# Patient Record
Sex: Male | Born: 1940 | Race: White | Hispanic: No | Marital: Married | Smoking: Current every day smoker
Health system: Southern US, Community
[De-identification: ages and names within clinical notes are randomized; demographics above are authoritative.]

## PROBLEM LIST (undated history)

## (undated) DIAGNOSIS — J449 Chronic obstructive pulmonary disease, unspecified: Secondary | ICD-10-CM

## (undated) DIAGNOSIS — I10 Essential (primary) hypertension: Secondary | ICD-10-CM

## (undated) HISTORY — PX: BACK SURGERY: SHX140

## (undated) HISTORY — PX: OTHER SURGICAL HISTORY: SHX169

---

## 2016-02-06 ENCOUNTER — Inpatient Hospital Stay (HOSPITAL_COMMUNITY)
Admission: EM | Admit: 2016-02-06 | Discharge: 2016-02-11 | DRG: 516 | Disposition: A | Discharge status: Home Health | Payer: Medicare PPO | Attending: General Surgery | Admitting: General Surgery

## 2016-02-06 ENCOUNTER — Emergency Department (HOSPITAL_COMMUNITY): Payer: Medicare PPO

## 2016-02-06 ENCOUNTER — Encounter (HOSPITAL_COMMUNITY): Payer: Self-pay

## 2016-02-06 DIAGNOSIS — S32461A Displaced associated transverse-posterior fracture of right acetabulum, initial encounter for closed fracture: Secondary | ICD-10-CM | POA: Diagnosis present

## 2016-02-06 DIAGNOSIS — R339 Retention of urine, unspecified: Secondary | ICD-10-CM | POA: Diagnosis not present

## 2016-02-06 DIAGNOSIS — Z79899 Other long term (current) drug therapy: Secondary | ICD-10-CM | POA: Diagnosis not present

## 2016-02-06 DIAGNOSIS — R319 Hematuria, unspecified: Secondary | ICD-10-CM | POA: Diagnosis present

## 2016-02-06 DIAGNOSIS — D62 Acute posthemorrhagic anemia: Secondary | ICD-10-CM | POA: Diagnosis not present

## 2016-02-06 DIAGNOSIS — K432 Incisional hernia without obstruction or gangrene: Secondary | ICD-10-CM | POA: Diagnosis not present

## 2016-02-06 DIAGNOSIS — N179 Acute kidney failure, unspecified: Secondary | ICD-10-CM | POA: Diagnosis present

## 2016-02-06 DIAGNOSIS — S5002XA Contusion of left elbow, initial encounter: Secondary | ICD-10-CM | POA: Diagnosis present

## 2016-02-06 DIAGNOSIS — I10 Essential (primary) hypertension: Secondary | ICD-10-CM | POA: Diagnosis not present

## 2016-02-06 DIAGNOSIS — J449 Chronic obstructive pulmonary disease, unspecified: Secondary | ICD-10-CM | POA: Diagnosis present

## 2016-02-06 DIAGNOSIS — Z419 Encounter for procedure for purposes other than remedying health state, unspecified: Secondary | ICD-10-CM

## 2016-02-06 DIAGNOSIS — R911 Solitary pulmonary nodule: Secondary | ICD-10-CM | POA: Diagnosis not present

## 2016-02-06 DIAGNOSIS — D696 Thrombocytopenia, unspecified: Secondary | ICD-10-CM | POA: Diagnosis present

## 2016-02-06 DIAGNOSIS — N401 Enlarged prostate with lower urinary tract symptoms: Secondary | ICD-10-CM | POA: Diagnosis not present

## 2016-02-06 DIAGNOSIS — F17210 Nicotine dependence, cigarettes, uncomplicated: Secondary | ICD-10-CM | POA: Diagnosis not present

## 2016-02-06 DIAGNOSIS — M79602 Pain in left arm: Secondary | ICD-10-CM | POA: Diagnosis present

## 2016-02-06 DIAGNOSIS — S32401A Unspecified fracture of right acetabulum, initial encounter for closed fracture: Secondary | ICD-10-CM | POA: Diagnosis present

## 2016-02-06 DIAGNOSIS — T07XXXA Unspecified multiple injuries, initial encounter: Secondary | ICD-10-CM

## 2016-02-06 DIAGNOSIS — N400 Enlarged prostate without lower urinary tract symptoms: Secondary | ICD-10-CM | POA: Diagnosis present

## 2016-02-06 HISTORY — DX: Chronic obstructive pulmonary disease, unspecified: J44.9

## 2016-02-06 HISTORY — DX: Essential (primary) hypertension: I10

## 2016-02-06 LAB — PROTIME-INR
INR: 1.08 (ref 0.00–1.49)
Prothrombin Time: 14.2 seconds (ref 11.6–15.2)

## 2016-02-06 LAB — I-STAT CHEM 8, ED
BUN: 32 mg/dL — ABNORMAL HIGH (ref 6–20)
CALCIUM ION: 1.22 mmol/L (ref 1.13–1.30)
CREATININE: 1.4 mg/dL — AB (ref 0.61–1.24)
Chloride: 109 mmol/L (ref 101–111)
Glucose, Bld: 118 mg/dL — ABNORMAL HIGH (ref 65–99)
HEMATOCRIT: 40 % (ref 39.0–52.0)
HEMOGLOBIN: 13.6 g/dL (ref 13.0–17.0)
Potassium: 4.8 mmol/L (ref 3.5–5.1)
Sodium: 140 mmol/L (ref 135–145)
TCO2: 19 mmol/L (ref 0–100)

## 2016-02-06 LAB — COMPREHENSIVE METABOLIC PANEL
ALBUMIN: 3.2 g/dL — AB (ref 3.5–5.0)
ALT: 24 U/L (ref 17–63)
AST: 25 U/L (ref 15–41)
Alkaline Phosphatase: 46 U/L (ref 38–126)
Anion gap: 10 (ref 5–15)
BUN: 30 mg/dL — AB (ref 6–20)
CHLORIDE: 109 mmol/L (ref 101–111)
CO2: 19 mmol/L — AB (ref 22–32)
CREATININE: 1.48 mg/dL — AB (ref 0.61–1.24)
Calcium: 9 mg/dL (ref 8.9–10.3)
GFR calc Af Amer: 52 mL/min — ABNORMAL LOW (ref >60)
GFR calc non Af Amer: 45 mL/min — ABNORMAL LOW (ref >60)
GLUCOSE: 126 mg/dL — AB (ref 65–99)
Potassium: 4.9 mmol/L (ref 3.5–5.1)
SODIUM: 138 mmol/L (ref 135–145)
Total Bilirubin: 0.8 mg/dL (ref 0.3–1.2)
Total Protein: 6.5 g/dL (ref 6.5–8.1)

## 2016-02-06 LAB — URINALYSIS, ROUTINE W REFLEX MICROSCOPIC
Bilirubin Urine: NEGATIVE
Glucose, UA: NEGATIVE mg/dL
KETONES UR: NEGATIVE mg/dL
LEUKOCYTES UA: NEGATIVE
NITRITE: NEGATIVE
PH: 5.5 (ref 5.0–8.0)
Protein, ur: NEGATIVE mg/dL
SPECIFIC GRAVITY, URINE: 1.01 (ref 1.005–1.030)

## 2016-02-06 LAB — URINE MICROSCOPIC-ADD ON

## 2016-02-06 LAB — CBC
HCT: 40.1 % (ref 39.0–52.0)
Hemoglobin: 13.1 g/dL (ref 13.0–17.0)
MCH: 28.4 pg (ref 26.0–34.0)
MCHC: 32.7 g/dL (ref 30.0–36.0)
MCV: 86.8 fL (ref 78.0–100.0)
PLATELETS: 119 10*3/uL — AB (ref 150–400)
RBC: 4.62 MIL/uL (ref 4.22–5.81)
RDW: 15.5 % (ref 11.5–15.5)
WBC: 6.2 10*3/uL (ref 4.0–10.5)

## 2016-02-06 LAB — SAMPLE TO BLOOD BANK

## 2016-02-06 LAB — MRSA PCR SCREENING: MRSA by PCR: NEGATIVE

## 2016-02-06 MED ORDER — ENALAPRIL MALEATE 10 MG PO TABS
10.0000 mg | ORAL_TABLET | Freq: Every day | ORAL | Status: DC
  Administered 2016-02-07 – 2016-02-11 (×4): 10 mg via ORAL
  Filled 2016-02-06 (×5): qty 1

## 2016-02-06 MED ORDER — FENTANYL CITRATE (PF) 100 MCG/2ML IJ SOLN
50.0000 ug | Freq: Once | INTRAMUSCULAR | Status: AC
  Administered 2016-02-06: 50 ug via INTRAVENOUS
  Filled 2016-02-06: qty 2

## 2016-02-06 MED ORDER — DOCUSATE SODIUM 100 MG PO CAPS
100.0000 mg | ORAL_CAPSULE | Freq: Two times a day (BID) | ORAL | Status: DC
  Administered 2016-02-06 – 2016-02-08 (×5): 100 mg via ORAL
  Filled 2016-02-06 (×5): qty 1

## 2016-02-06 MED ORDER — BISACODYL 10 MG RE SUPP: 10.0000 mg | Freq: Every day | RECTAL | Status: DC | PRN

## 2016-02-06 MED ORDER — SODIUM CHLORIDE 0.9 % IV BOLUS (SEPSIS)
500.0000 mL | Freq: Once | INTRAVENOUS | Status: AC
  Administered 2016-02-06: 500 mL via INTRAVENOUS

## 2016-02-06 MED ORDER — GABAPENTIN 300 MG PO CAPS
300.0000 mg | ORAL_CAPSULE | Freq: Three times a day (TID) | ORAL | Status: DC
  Administered 2016-02-06 – 2016-02-11 (×12): 300 mg via ORAL
  Filled 2016-02-06 (×12): qty 1

## 2016-02-06 MED ORDER — IOPAMIDOL (ISOVUE-300) INJECTION 61%
INTRAVENOUS | Status: AC
  Administered 2016-02-06: 80 mL
  Filled 2016-02-06: qty 100

## 2016-02-06 MED ORDER — ACETAMINOPHEN 325 MG PO TABS: 650.0000 mg | ORAL_TABLET | ORAL | Status: DC | PRN

## 2016-02-06 MED ORDER — SODIUM CHLORIDE 0.9 % IV SOLN
INTRAVENOUS | Status: DC
  Administered 2016-02-06 – 2016-02-08 (×5): via INTRAVENOUS

## 2016-02-06 MED ORDER — MORPHINE SULFATE (PF) 2 MG/ML IV SOLN
2.0000 mg | INTRAVENOUS | Status: DC | PRN
  Administered 2016-02-06 – 2016-02-07 (×2): 2 mg via INTRAVENOUS
  Filled 2016-02-06 (×2): qty 1

## 2016-02-06 MED ORDER — OXYCODONE HCL 5 MG PO TABS
10.0000 mg | ORAL_TABLET | ORAL | Status: DC | PRN
  Administered 2016-02-06 – 2016-02-07 (×2): 10 mg via ORAL
  Filled 2016-02-06 (×2): qty 2

## 2016-02-06 MED ORDER — TETANUS-DIPHTH-ACELL PERTUSSIS 5-2.5-18.5 LF-MCG/0.5 IM SUSP
0.5000 mL | Freq: Once | INTRAMUSCULAR | Status: AC
  Administered 2016-02-06: 0.5 mL via INTRAMUSCULAR
  Filled 2016-02-06: qty 0.5

## 2016-02-06 MED ORDER — TERAZOSIN HCL 5 MG PO CAPS
10.0000 mg | ORAL_CAPSULE | Freq: Every day | ORAL | Status: DC
Start: 2016-02-06 — End: 2016-02-10
  Administered 2016-02-06 – 2016-02-09 (×4): 10 mg via ORAL
  Filled 2016-02-06 (×4): qty 2

## 2016-02-06 MED ORDER — ONDANSETRON HCL 4 MG PO TABS: 4.0000 mg | ORAL_TABLET | Freq: Four times a day (QID) | ORAL | Status: DC | PRN

## 2016-02-06 MED ORDER — IOPAMIDOL (ISOVUE-300) INJECTION 61%
INTRAVENOUS | Status: AC
  Filled 2016-02-06: qty 100

## 2016-02-06 MED ORDER — FINASTERIDE 5 MG PO TABS
5.0000 mg | ORAL_TABLET | Freq: Every evening | ORAL | Status: DC
  Administered 2016-02-06 – 2016-02-10 (×4): 5 mg via ORAL
  Filled 2016-02-06 (×4): qty 1

## 2016-02-06 MED ORDER — ONDANSETRON HCL 4 MG/2ML IJ SOLN: 4.0000 mg | Freq: Four times a day (QID) | INTRAMUSCULAR | Status: DC | PRN
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
## 2016-02-06 NOTE — Consult Note (Signed)
Full consult to follow. Right transverse posterior wall acetabular fracture with large post wall segment. Will need repair, likely Tues.  Myrene GalasMichael Breydon Senters, MD [facility] [phone] (p)

## 2016-02-06 NOTE — ED Notes (Signed)
Clean and apply bactracin to skin of left and right hands and forearms. Applied non-stick pads and wrapped with gauze.
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
## 2016-02-07 DIAGNOSIS — D62 Acute posthemorrhagic anemia: Secondary | ICD-10-CM | POA: Diagnosis not present

## 2016-02-07 DIAGNOSIS — T07XXXA Unspecified multiple injuries, initial encounter: Secondary | ICD-10-CM

## 2016-02-07 DIAGNOSIS — S32401A Unspecified fracture of right acetabulum, initial encounter for closed fracture: Secondary | ICD-10-CM | POA: Diagnosis present

## 2016-02-07 DIAGNOSIS — R911 Solitary pulmonary nodule: Secondary | ICD-10-CM | POA: Diagnosis present

## 2016-02-07 DIAGNOSIS — N179 Acute kidney failure, unspecified: Secondary | ICD-10-CM | POA: Diagnosis present

## 2016-02-07 LAB — TYPE AND SCREEN
ABO/RH(D): A POS
ANTIBODY SCREEN: NEGATIVE

## 2016-02-07 LAB — BASIC METABOLIC PANEL
ANION GAP: 8 (ref 5–15)
BUN: 30 mg/dL — ABNORMAL HIGH (ref 6–20)
CHLORIDE: 110 mmol/L (ref 101–111)
CO2: 15 mmol/L — AB (ref 22–32)
Calcium: 8 mg/dL — ABNORMAL LOW (ref 8.9–10.3)
Creatinine, Ser: 1.28 mg/dL — ABNORMAL HIGH (ref 0.61–1.24)
GFR calc non Af Amer: 53 mL/min — ABNORMAL LOW (ref >60)
GLUCOSE: 91 mg/dL (ref 65–99)
Potassium: 5.4 mmol/L — ABNORMAL HIGH (ref 3.5–5.1)
Sodium: 133 mmol/L — ABNORMAL LOW (ref 135–145)

## 2016-02-07 LAB — CBC
HCT: 30 % — ABNORMAL LOW (ref 39.0–52.0)
HEMOGLOBIN: 10.2 g/dL — AB (ref 13.0–17.0)
MCH: 29.7 pg (ref 26.0–34.0)
MCHC: 34 g/dL (ref 30.0–36.0)
MCV: 87.2 fL (ref 78.0–100.0)
Platelets: 107 10*3/uL — ABNORMAL LOW (ref 150–400)
RBC: 3.44 MIL/uL — AB (ref 4.22–5.81)
RDW: 15.3 % (ref 11.5–15.5)
WBC: 5.4 10*3/uL (ref 4.0–10.5)

## 2016-02-07 LAB — ABO/RH: ABO/RH(D): A POS

## 2016-02-07 LAB — PROTIME-INR
INR: 1.19 (ref 0.00–1.49)
PROTHROMBIN TIME: 15.3 s — AB (ref 11.6–15.2)

## 2016-02-07 LAB — APTT: aPTT: 32 seconds (ref 24–37)

## 2016-02-07 MED ORDER — TRAMADOL HCL 50 MG PO TABS
50.0000 mg | ORAL_TABLET | Freq: Four times a day (QID) | ORAL | Status: DC | PRN
Start: 2016-02-07 — End: 2016-02-08
  Administered 2016-02-07 – 2016-02-08 (×3): 100 mg via ORAL
  Filled 2016-02-07 (×3): qty 2

## 2016-02-07 MED ORDER — IPRATROPIUM-ALBUTEROL 0.5-2.5 (3) MG/3ML IN SOLN
3.0000 mL | Freq: Four times a day (QID) | RESPIRATORY_TRACT | Status: DC
  Administered 2016-02-07: 3 mL via RESPIRATORY_TRACT
  Filled 2016-02-07: qty 3

## 2016-02-07 MED ORDER — MORPHINE SULFATE (PF) 2 MG/ML IV SOLN
2.0000 mg | INTRAVENOUS | Status: DC | PRN
  Administered 2016-02-07 – 2016-02-08 (×3): 2 mg via INTRAVENOUS
  Filled 2016-02-07 (×3): qty 1

## 2016-02-07 MED ORDER — IPRATROPIUM-ALBUTEROL 0.5-2.5 (3) MG/3ML IN SOLN
3.0000 mL | Freq: Two times a day (BID) | RESPIRATORY_TRACT | Status: DC
  Administered 2016-02-08 – 2016-02-11 (×5): 3 mL via RESPIRATORY_TRACT
  Filled 2016-02-07 (×7): qty 3

## 2016-02-07 MED ORDER — IPRATROPIUM-ALBUTEROL 0.5-2.5 (3) MG/3ML IN SOLN: 3.0000 mL | Freq: Four times a day (QID) | RESPIRATORY_TRACT | Status: DC | PRN

## 2016-02-07 MED ORDER — POLYETHYLENE GLYCOL 3350 17 G PO PACK
17.0000 g | PACK | Freq: Every day | ORAL | Status: DC
  Administered 2016-02-07 – 2016-02-10 (×3): 17 g via ORAL
  Filled 2016-02-07 (×4): qty 1

## 2016-02-07 MED ORDER — CEFAZOLIN SODIUM-DEXTROSE 2-4 GM/100ML-% IV SOLN
2.0000 g | Freq: Once | INTRAVENOUS | Status: AC
  Administered 2016-02-08: 2 g via INTRAVENOUS
  Filled 2016-02-07: qty 100

## 2016-02-07 NOTE — Progress Notes (Signed)
RT protocol assessment done on patient.  Patient with hx of COPD, currently non-ambulatory.  No oxygen requirements, recent CXR clear, BBS clear on exam.  Assessment score of 4.  HHN tx changed to BID and Q6PRN.
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
## 2016-02-07 NOTE — Progress Notes (Signed)
Report given to DJ, Charity fundraiserN for [facility].
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
## 2016-02-08 ENCOUNTER — Inpatient Hospital Stay (HOSPITAL_COMMUNITY): Payer: Medicare PPO

## 2016-02-08 ENCOUNTER — Inpatient Hospital Stay (HOSPITAL_COMMUNITY): Payer: Medicare PPO | Admitting: Certified Registered"

## 2016-02-08 ENCOUNTER — Encounter (HOSPITAL_COMMUNITY): Payer: Self-pay | Admitting: Surgery

## 2016-02-08 ENCOUNTER — Encounter (HOSPITAL_COMMUNITY): Admission: EM | Disposition: A | Discharge status: Home Health | Payer: Self-pay | Source: Home / Self Care

## 2016-02-08 HISTORY — PX: ORIF ACETABULAR FRACTURE: SHX5029

## 2016-02-08 LAB — BASIC METABOLIC PANEL
Anion gap: 7 (ref 5–15)
BUN: 20 mg/dL (ref 6–20)
CALCIUM: 8.1 mg/dL — AB (ref 8.9–10.3)
CO2: 18 mmol/L — AB (ref 22–32)
CREATININE: 1.16 mg/dL (ref 0.61–1.24)
Chloride: 109 mmol/L (ref 101–111)
GFR calc Af Amer: >60 mL/min (ref >60)
GFR calc non Af Amer: >60 mL/min (ref >60)
GLUCOSE: 100 mg/dL — AB (ref 65–99)
Potassium: 4.8 mmol/L (ref 3.5–5.1)
Sodium: 134 mmol/L — ABNORMAL LOW (ref 135–145)

## 2016-02-08 LAB — CBC
HEMATOCRIT: 29.2 % — AB (ref 39.0–52.0)
Hemoglobin: 9.8 g/dL — ABNORMAL LOW (ref 13.0–17.0)
MCH: 28.8 pg (ref 26.0–34.0)
MCHC: 33.6 g/dL (ref 30.0–36.0)
MCV: 85.9 fL (ref 78.0–100.0)
Platelets: 90 10*3/uL — ABNORMAL LOW (ref 150–400)
RBC: 3.4 MIL/uL — ABNORMAL LOW (ref 4.22–5.81)
RDW: 15.1 % (ref 11.5–15.5)
WBC: 4.6 10*3/uL (ref 4.0–10.5)

## 2016-02-08 SURGERY — OPEN REDUCTION INTERNAL FIXATION (ORIF) ACETABULAR FRACTURE
Anesthesia: General | Laterality: Right

## 2016-02-08 MED ORDER — SUGAMMADEX SODIUM 200 MG/2ML IV SOLN
INTRAVENOUS | Status: AC
  Filled 2016-02-08: qty 2

## 2016-02-08 MED ORDER — SODIUM CHLORIDE 0.9 % IR SOLN
Status: DC | PRN
  Administered 2016-02-08: 3000 mL

## 2016-02-08 MED ORDER — ONDANSETRON HCL 4 MG/2ML IJ SOLN
INTRAMUSCULAR | Status: DC | PRN
  Administered 2016-02-08: 4 mg via INTRAVENOUS

## 2016-02-08 MED ORDER — HYDROCODONE-ACETAMINOPHEN 5-325 MG PO TABS
ORAL_TABLET | ORAL | Status: AC
  Filled 2016-02-08: qty 1

## 2016-02-08 MED ORDER — LACTATED RINGERS IV SOLN
INTRAVENOUS | Status: DC
  Administered 2016-02-08 (×2): via INTRAVENOUS

## 2016-02-08 MED ORDER — PROPOFOL 10 MG/ML IV BOLUS
INTRAVENOUS | Status: AC
  Filled 2016-02-08: qty 20

## 2016-02-08 MED ORDER — HYDROCODONE-ACETAMINOPHEN 5-325 MG PO TABS
0.5000 | ORAL_TABLET | ORAL | Status: DC | PRN
Start: 2016-02-08 — End: 2016-02-11
  Administered 2016-02-08 (×2): 2 via ORAL
  Filled 2016-02-08 (×2): qty 2

## 2016-02-08 MED ORDER — ROCURONIUM BROMIDE 100 MG/10ML IV SOLN
INTRAVENOUS | Status: DC | PRN
  Administered 2016-02-08 (×2): 20 mg via INTRAVENOUS
  Administered 2016-02-08: 50 mg via INTRAVENOUS

## 2016-02-08 MED ORDER — HYDROCODONE-ACETAMINOPHEN 7.5-325 MG PO TABS
1.0000 | ORAL_TABLET | Freq: Once | ORAL | Status: AC | PRN
  Administered 2016-02-08: 1 via ORAL

## 2016-02-08 MED ORDER — TRAMADOL HCL 50 MG PO TABS
100.0000 mg | ORAL_TABLET | Freq: Four times a day (QID) | ORAL | Status: DC
  Administered 2016-02-08 – 2016-02-11 (×11): 100 mg via ORAL
  Filled 2016-02-08 (×11): qty 2

## 2016-02-08 MED ORDER — CEFAZOLIN SODIUM 1-5 GM-% IV SOLN
1.0000 g | Freq: Once | INTRAVENOUS | Status: AC
  Administered 2016-02-08: 1 g via INTRAVENOUS

## 2016-02-08 MED ORDER — LIDOCAINE HCL (CARDIAC) 20 MG/ML IV SOLN
INTRAVENOUS | Status: DC | PRN
  Administered 2016-02-08: 80 mg via INTRAVENOUS

## 2016-02-08 MED ORDER — EPHEDRINE SULFATE 50 MG/ML IJ SOLN
INTRAMUSCULAR | Status: DC | PRN
  Administered 2016-02-08: 15 mg via INTRAVENOUS
  Administered 2016-02-08 (×2): 10 mg via INTRAVENOUS

## 2016-02-08 MED ORDER — ONDANSETRON HCL 4 MG/2ML IJ SOLN: 4.0000 mg | Freq: Once | INTRAMUSCULAR | Status: DC | PRN

## 2016-02-08 MED ORDER — ONDANSETRON HCL 4 MG/2ML IJ SOLN
INTRAMUSCULAR | Status: AC
  Filled 2016-02-08: qty 2

## 2016-02-08 MED ORDER — FENTANYL CITRATE (PF) 250 MCG/5ML IJ SOLN
INTRAMUSCULAR | Status: DC | PRN
  Administered 2016-02-08: 50 ug via INTRAVENOUS

## 2016-02-08 MED ORDER — PHENYLEPHRINE HCL 10 MG/ML IJ SOLN
10.0000 mg | INTRAVENOUS | Status: DC | PRN
  Administered 2016-02-08: 20 ug/min via INTRAVENOUS

## 2016-02-08 MED ORDER — FENTANYL CITRATE (PF) 250 MCG/5ML IJ SOLN
INTRAMUSCULAR | Status: AC
  Filled 2016-02-08: qty 5

## 2016-02-08 MED ORDER — EPHEDRINE 5 MG/ML INJ
INTRAVENOUS | Status: AC
  Filled 2016-02-08: qty 10

## 2016-02-08 MED ORDER — ROCURONIUM BROMIDE 50 MG/5ML IV SOLN
INTRAVENOUS | Status: AC
  Filled 2016-02-08: qty 1

## 2016-02-08 MED ORDER — GLYCOPYRROLATE 0.2 MG/ML IV SOSY
PREFILLED_SYRINGE | INTRAVENOUS | Status: AC
  Filled 2016-02-08: qty 3

## 2016-02-08 MED ORDER — CEFAZOLIN SODIUM-DEXTROSE 2-4 GM/100ML-% IV SOLN
2.0000 g | Freq: Three times a day (TID) | INTRAVENOUS | Status: AC
  Administered 2016-02-08 – 2016-02-09 (×3): 2 g via INTRAVENOUS
  Filled 2016-02-08 (×3): qty 100

## 2016-02-08 MED ORDER — HYDROMORPHONE HCL 1 MG/ML IJ SOLN: 0.2500 mg | INTRAMUSCULAR | Status: DC | PRN

## 2016-02-08 MED ORDER — PHENYLEPHRINE HCL 10 MG/ML IJ SOLN
INTRAMUSCULAR | Status: DC | PRN
  Administered 2016-02-08: 200 ug via INTRAVENOUS
  Administered 2016-02-08: 80 ug via INTRAVENOUS
  Administered 2016-02-08: 120 ug via INTRAVENOUS

## 2016-02-08 MED ORDER — PROPOFOL 10 MG/ML IV BOLUS
INTRAVENOUS | Status: DC | PRN
  Administered 2016-02-08: 150 mg via INTRAVENOUS

## 2016-02-08 MED ORDER — 0.9 % SODIUM CHLORIDE (POUR BTL) OPTIME
TOPICAL | Status: DC | PRN
  Administered 2016-02-08: 1000 mL

## 2016-02-08 MED ORDER — SUGAMMADEX SODIUM 200 MG/2ML IV SOLN
INTRAVENOUS | Status: DC | PRN
  Administered 2016-02-08: 300 mg via INTRAVENOUS

## 2016-02-08 MED ORDER — CEFAZOLIN SODIUM 1-5 GM-% IV SOLN
INTRAVENOUS | Status: AC
  Filled 2016-02-08: qty 50

## 2016-02-08 MED ORDER — HYDROCODONE-ACETAMINOPHEN 7.5-325 MG PO TABS
ORAL_TABLET | ORAL | Status: AC
  Filled 2016-02-08: qty 1

## 2016-02-08 MED ORDER — SUGAMMADEX SODIUM 500 MG/5ML IV SOLN
INTRAVENOUS | Status: AC
  Filled 2016-02-08: qty 5

## 2016-02-08 SURGICAL SUPPLY — 81 items
APPLIER CLIP 11 MED OPEN (CLIP)
BIT DRILL AO MATTA 2.5MX230M (BIT) ×1 IMPLANT
BIT DRILL TWST MATTA 3.5MX195M (BIT) ×1 IMPLANT
BONE CANC CHIPS 20CC PCAN1/4 (Bone Implant) ×3 IMPLANT
BRUSH SCRUB DISP (MISCELLANEOUS) ×6 IMPLANT
CHIPS CANC BONE 20CC PCAN1/4 (Bone Implant) ×1 IMPLANT
CLIP APPLIE 11 MED OPEN (CLIP) IMPLANT
CLOSURE WOUND 1/2 X4 (GAUZE/BANDAGES/DRESSINGS) ×1
DRAIN CHANNEL 10F 3/8 F FF (DRAIN) IMPLANT
DRAIN CHANNEL 15F RND FF W/TCR (WOUND CARE) IMPLANT
DRAPE C-ARM 42X72 X-RAY (DRAPES) ×3 IMPLANT
DRAPE C-ARMOR (DRAPES) ×3 IMPLANT
DRAPE INCISE IOBAN 66X45 STRL (DRAPES) IMPLANT
DRAPE INCISE IOBAN 85X60 (DRAPES) ×6 IMPLANT
DRAPE ORTHO SPLIT 77X108 STRL (DRAPES) ×4
DRAPE SURG ORHT 6 SPLT 77X108 (DRAPES) ×2 IMPLANT
DRAPE U-SHAPE 47X51 STRL (DRAPES) ×3 IMPLANT
DRILL BIT AO MATTA 2.5MX230M (BIT) ×3
DRILL TWIST AO MATTA 3.5MX195M (BIT) ×3
DRSG MEPILEX BORDER 4X12 (GAUZE/BANDAGES/DRESSINGS) ×3 IMPLANT
DRSG MEPILEX BORDER 4X8 (GAUZE/BANDAGES/DRESSINGS) ×3 IMPLANT
ELECT BLADE 6.5 EXT (BLADE) ×3 IMPLANT
ELECT REM PT RETURN 9FT ADLT (ELECTROSURGICAL) ×3
ELECTRODE REM PT RTRN 9FT ADLT (ELECTROSURGICAL) ×1 IMPLANT
EVACUATOR 1/8 PVC DRAIN (DRAIN) IMPLANT
EVACUATOR SILICONE 100CC (DRAIN) ×3 IMPLANT
GLOVE BIO SURGEON STRL SZ 6.5 (GLOVE) ×4 IMPLANT
GLOVE BIO SURGEON STRL SZ7.5 (GLOVE) ×3 IMPLANT
GLOVE BIO SURGEON STRL SZ8 (GLOVE) ×3 IMPLANT
GLOVE BIO SURGEONS STRL SZ 6.5 (GLOVE) ×2
GLOVE BIOGEL PI IND STRL 6.5 (GLOVE) ×1 IMPLANT
GLOVE BIOGEL PI IND STRL 7.5 (GLOVE) ×1 IMPLANT
GLOVE BIOGEL PI IND STRL 8 (GLOVE) ×1 IMPLANT
GLOVE BIOGEL PI INDICATOR 6.5 (GLOVE) ×2
GLOVE BIOGEL PI INDICATOR 7.5 (GLOVE) ×2
GLOVE BIOGEL PI INDICATOR 8 (GLOVE) ×2
GOWN STRL REUS W/ TWL LRG LVL3 (GOWN DISPOSABLE) ×2 IMPLANT
GOWN STRL REUS W/ TWL XL LVL3 (GOWN DISPOSABLE) ×1 IMPLANT
GOWN STRL REUS W/TWL 2XL LVL3 (GOWN DISPOSABLE) ×3 IMPLANT
GOWN STRL REUS W/TWL LRG LVL3 (GOWN DISPOSABLE) ×4
GOWN STRL REUS W/TWL XL LVL3 (GOWN DISPOSABLE) ×2
HANDPIECE INTERPULSE COAX TIP (DISPOSABLE) ×2
KIT BASIN OR (CUSTOM PROCEDURE TRAY) ×3 IMPLANT
KIT ROOM TURNOVER OR (KITS) ×3 IMPLANT
LIGHT WAVEGUIDE WIDE FLAT (MISCELLANEOUS) ×3 IMPLANT
LOOP VESSEL MAXI BLUE (MISCELLANEOUS) IMPLANT
MANIFOLD NEPTUNE II (INSTRUMENTS) ×3 IMPLANT
NS IRRIG 1000ML POUR BTL (IV SOLUTION) ×3 IMPLANT
PACK TOTAL JOINT (CUSTOM PROCEDURE TRAY) ×3 IMPLANT
PAD ARMBOARD 7.5X6 YLW CONV (MISCELLANEOUS) ×6 IMPLANT
PIN APEX 6X180MM EXFIX (EXFIX) ×3 IMPLANT
PLATE ACET STRT 70.5M 6H (Plate) ×3 IMPLANT
PLATE ACET STRT 94.5M 8H (Plate) ×3 IMPLANT
RETRIEVER SUT HEWSON (MISCELLANEOUS) ×3 IMPLANT
SCREW CORTEX ST MATTA 3.5X14 (Screw) ×3 IMPLANT
SCREW CORTEX ST MATTA 3.5X16MM (Screw) ×3 IMPLANT
SCREW CORTEX ST MATTA 3.5X20 (Screw) ×3 IMPLANT
SCREW CORTEX ST MATTA 3.5X28MM (Screw) ×6 IMPLANT
SCREW CORTEX ST MATTA 3.5X30MM (Screw) ×6 IMPLANT
SCREW CORTEX ST MATTA 3.5X32MM (Screw) ×3 IMPLANT
SCREW CORTEX ST MATTA 3.5X36MM (Screw) ×3 IMPLANT
SCREW CORTEX ST MATTA 3.5X40MM (Screw) ×6 IMPLANT
SCREW CORTEX ST MATTA 3.5X50MM (Screw) ×3 IMPLANT
SET HNDPC FAN SPRY TIP SCT (DISPOSABLE) ×1 IMPLANT
SPONGE LAP 18X18 X RAY DECT (DISPOSABLE) ×9 IMPLANT
STAPLER VISISTAT 35W (STAPLE) ×3 IMPLANT
STRIP CLOSURE SKIN 1/2X4 (GAUZE/BANDAGES/DRESSINGS) ×2 IMPLANT
SUCTION FRAZIER HANDLE 10FR (MISCELLANEOUS) ×2
SUCTION TUBE FRAZIER 10FR DISP (MISCELLANEOUS) ×1 IMPLANT
SUT FIBERWIRE #2 38 T-5 BLUE (SUTURE)
SUT VIC AB 0 CT1 27 (SUTURE) ×2
SUT VIC AB 0 CT1 27XBRD ANBCTR (SUTURE) ×1 IMPLANT
SUT VIC AB 1 CT1 18XCR BRD 8 (SUTURE) ×1 IMPLANT
SUT VIC AB 1 CT1 8-18 (SUTURE) ×2
SUT VIC AB 2-0 CT1 27 (SUTURE) ×2
SUT VIC AB 2-0 CT1 TAPERPNT 27 (SUTURE) ×1 IMPLANT
SUTURE FIBERWR #2 38 T-5 BLUE (SUTURE) IMPLANT
TOWEL OR 17X24 6PK STRL BLUE (TOWEL DISPOSABLE) ×3 IMPLANT
TOWEL OR 17X26 10 PK STRL BLUE (TOWEL DISPOSABLE) ×6 IMPLANT
TRAY FOLEY CATH 16FRSI W/METER (SET/KITS/TRAYS/PACK) IMPLANT
WATER STERILE IRR 1000ML POUR (IV SOLUTION) IMPLANT

## 2016-02-08 NOTE — Anesthesia Procedure Notes (Signed)
Procedure Name: Intubation Date/Time: 02/08/2016 2:58 PM Performed by: Jefm MilesENNIE, Nelle Sayed E Pre-anesthesia Checklist: Patient identified, Emergency Drugs available, Suction available, Patient being monitored and Timeout performed Patient Re-evaluated:Patient Re-evaluated prior to inductionOxygen Delivery Method: Circle system utilized Preoxygenation: Pre-oxygenation with 100% oxygen Intubation Type: IV induction Ventilation: Mask ventilation without difficulty Laryngoscope Size: Mac and 3 Grade View: Grade I Tube type: Oral Tube size: 7.5 mm Number of attempts: 1 Airway Equipment and Method: Stylet Placement Confirmation: ETT inserted through vocal cords under direct vision,  positive ETCO2 and breath sounds checked- equal and bilateral Secured at: 22 cm Tube secured with: Tape Dental Injury: Teeth and Oropharynx as per pre-operative assessment

## 2016-02-08 NOTE — Anesthesia Preprocedure Evaluation (Addendum)
Anesthesia Evaluation  Patient identified by MRN, date of birth, ID band Patient awake    Reviewed: Allergy & Precautions, H&P , NPO status , Patient's Chart, lab work & pertinent test results  History of Anesthesia Complications Negative for: history of anesthetic complications  Airway Mallampati: III  TM Distance: >3 FB Neck ROM: full    Dental  (+) Upper Dentures, Edentulous Upper, Edentulous Lower, Dental Advisory Given   Pulmonary COPD, Current Smoker,    Pulmonary exam normal breath sounds clear to auscultation       Cardiovascular hypertension, Normal cardiovascular exam Rhythm:regular Rate:Normal     Neuro/Psych negative neurological ROS     GI/Hepatic negative GI ROS, Neg liver ROS,   Endo/Other  negative endocrine ROS  Renal/GU Renal disease     Musculoskeletal   Abdominal   Peds  Hematology  (+) anemia ,   Anesthesia Other Findings   Reproductive/Obstetrics negative OB ROS                           Anesthesia Physical Anesthesia Plan  ASA: II  Anesthesia Plan: General   Post-op Pain Management:    Induction: Intravenous  Airway Management Planned: Oral ETT  Additional Equipment:   Intra-op Plan:   Post-operative Plan:   Informed Consent: I have reviewed the patients History and Physical, chart, labs and discussed the procedure including the risks, benefits and alternatives for the proposed anesthesia with the patient or authorized representative who has indicated his/her understanding and acceptance.   Dental Advisory Given  Plan Discussed with: Anesthesiologist, CRNA and Surgeon  Anesthesia Plan Comments:        Anesthesia Quick Evaluation
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
## 2016-02-08 NOTE — Anesthesia Postprocedure Evaluation (Signed)
Anesthesia Post Note  Patient: Pershing CoxVester Parco  Procedure(s) Performed: Procedure(s) (LRB): OPEN REDUCTION INTERNAL FIXATION (ORIF) ACETABULAR FRACTURE (Right)  Patient location during evaluation: PACU Anesthesia Type: General Level of consciousness: awake and alert Pain management: pain level controlled Vital Signs Assessment: post-procedure vital signs reviewed and stable Respiratory status: spontaneous breathing, nonlabored ventilation, respiratory function stable and patient connected to nasal cannula oxygen Cardiovascular status: blood pressure returned to baseline and stable Postop Assessment: no signs of nausea or vomiting Anesthetic complications: no    Last Vitals:  Filed Vitals:   02/08/16 0511 02/08/16 1750  BP: 121/52   Pulse: 69   Temp: 36.9 C 36.4 C  Resp: 16     Last Pain:  Filed Vitals:   02/08/16 1752  PainSc: Asleep                 Reino KentJudd, Rafan Sanders J

## 2016-02-08 NOTE — Progress Notes (Signed)
Orthopedic Tech Progress Note Patient Details:  Brandon Robbins Kinder 08/19/1941 [id number]  Patient ID: Brandon Robbins Morelos, male   DOB: 12/20/1940, 75 y.o.   MRN: [id number] Pt unable to use trapeze bar patient helper; RN notified  Nikki DomCrawford, Gloria Lambertson 02/08/2016, 8:20 AM
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
## 2016-02-08 NOTE — Transfer of Care (Signed)
Immediate Anesthesia Transfer of Care Note  Patient: Brandon Robbins  Procedure(s) Performed: Procedure(s): OPEN REDUCTION INTERNAL FIXATION (ORIF) ACETABULAR FRACTURE (Right)  Patient Location: PACU  Anesthesia Type:General  Level of Consciousness: lethargic and responds to stimulation  Airway & Oxygen Therapy: Patient Spontanous Breathing and Patient connected to nasal cannula oxygen  Post-op Assessment: Report given to RN  Post vital signs: Reviewed and stable  Last Vitals:  Filed Vitals:   02/07/16 2115 02/08/16 0511  BP: 123/61 121/52  Pulse: 66 69  Temp: 36.9 C 36.9 C  Resp: 15 16    Last Pain:  Filed Vitals:   02/08/16 1751  PainSc: Asleep      Patients Stated Pain Goal: 2 (02/08/16 0826)  Complications: No apparent anesthesia complications

## 2016-02-09 ENCOUNTER — Inpatient Hospital Stay (HOSPITAL_COMMUNITY): Payer: Medicare PPO

## 2016-02-09 ENCOUNTER — Encounter (HOSPITAL_COMMUNITY): Payer: Self-pay | Admitting: Orthopedic Surgery

## 2016-02-09 DIAGNOSIS — D696 Thrombocytopenia, unspecified: Secondary | ICD-10-CM | POA: Diagnosis not present

## 2016-02-09 LAB — CBC
HEMATOCRIT: 27.1 % — AB (ref 39.0–52.0)
Hemoglobin: 9 g/dL — ABNORMAL LOW (ref 13.0–17.0)
MCH: 28.2 pg (ref 26.0–34.0)
MCHC: 33.2 g/dL (ref 30.0–36.0)
MCV: 85 fL (ref 78.0–100.0)
PLATELETS: 89 10*3/uL — AB (ref 150–400)
RBC: 3.19 MIL/uL — ABNORMAL LOW (ref 4.22–5.81)
RDW: 14.9 % (ref 11.5–15.5)
WBC: 4.2 10*3/uL (ref 4.0–10.5)

## 2016-02-09 MED ORDER — WARFARIN SODIUM 7.5 MG PO TABS
7.5000 mg | ORAL_TABLET | Freq: Once | ORAL | Status: AC
  Administered 2016-02-09: 7.5 mg via ORAL
  Filled 2016-02-09: qty 1

## 2016-02-09 MED ORDER — COUMADIN BOOK
Freq: Once | Status: AC
  Administered 2016-02-09: 09:00:00
  Filled 2016-02-09 (×2): qty 1

## 2016-02-09 MED ORDER — WARFARIN - PHARMACIST DOSING INPATIENT: Freq: Every day | Status: DC

## 2016-02-09 MED ORDER — DOCUSATE SODIUM 100 MG PO CAPS
200.0000 mg | ORAL_CAPSULE | Freq: Two times a day (BID) | ORAL | Status: DC
  Administered 2016-02-09: 100 mg via ORAL
  Administered 2016-02-09 – 2016-02-11 (×3): 200 mg via ORAL
  Filled 2016-02-09 (×4): qty 2

## 2016-02-09 MED ORDER — WARFARIN VIDEO
Freq: Once | Status: AC
  Administered 2016-02-09: 09:00:00
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
## 2016-02-09 NOTE — Progress Notes (Signed)
ANTICOAGULATION CONSULT NOTE - Initial Consult  Pharmacy Consult for Coumadin Indication: VTE prophylaxis  No Known Allergies  Patient Measurements: Height: 6' (182.9 cm) Weight: 170 lb 13.7 oz (77.5 kg) IBW/kg (Calculated) : 77.6  Vital Signs: Temp: 98.2 F (36.8 C) (05/10 0629) Temp Source: Oral (05/10 0629) BP: 100/47 mmHg (05/10 0629) Pulse Rate: 61 (05/10 0629)  Labs:  Recent Labs  02/06/16 1243 02/06/16 1257 02/07/16 0349 02/07/16 0541 02/07/16 1017 02/08/16 0425 02/09/16 0352  HGB 13.1 13.6  --  10.2*  --  9.8* 9.0*  HCT 40.1 40.0  --  30.0*  --  29.2* 27.1*  PLT 119*  --   --  107*  --  90* 89*  APTT  --   --   --   --  32  --   --   LABPROT 14.2  --   --   --  15.3*  --   --   INR 1.08  --   --   --  1.19  --   --   CREATININE 1.48* 1.40* 1.28*  --   --  1.16  --     Estimated Creatinine Clearance: 61.2 mL/min (by C-G formula based on Cr of 1.16).   Medical History: Past Medical History  Diagnosis Date  . Hypertension   . COPD (chronic obstructive pulmonary disease) [facility](HCC)     Assessment: 75 year old male beginning Coumadin for VTE prophylaxis  Goal of Therapy:  INR 2-3 Monitor platelets by anticoagulation protocol: Yes   Plan:  Coumadin 7.5 mg po x 1 tonight Daily INR  Thank you Okey RegalLisa Zylee Marchiano, PharmD [phone] 02/09/2016,8:43 AM

## 2016-02-09 NOTE — Discharge Instructions (Addendum)
Orthopaedic Trauma Service Discharge Instructions   General Discharge Instructions  WEIGHT BEARING STATUS: Touchdown weightbearing Right Leg   RANGE OF MOTION/ACTIVITY: Posterior hip precautions Right Hip   Wound Care: daily wound care, see below. Ok to shower    Discharge Wound Care Instructions  Do NOT apply any ointments, solutions or lotions to pin sites or surgical wounds.  These prevent needed drainage and even though solutions like hydrogen peroxide kill bacteria, they also damage cells lining the pin sites that help fight infection.  Applying lotions or ointments can keep the wounds moist and can cause them to breakdown and open up as well. This can increase the risk for infection. When in doubt call the office.  Surgical incisions should be dressed daily.  If any drainage is noted, use one layer of adaptic, then gauze, Kerlix, and an ace wrap.  Once the incision is completely dry and without drainage, it may be left open to air out.  Showering may begin 36-48 hours later.  Cleaning gently with soap and water.  Traumatic wounds should be dressed daily as well.    One layer of adaptic, gauze, Kerlix, then ace wrap.  The adaptic can be discontinued once the draining has ceased    If you have a wet to dry dressing: wet the gauze with saline the squeeze as much saline out so the gauze is moist (not soaking wet), place moistened gauze over wound, then place a dry gauze over the moist one, followed by Kerlix wrap, then ace wrap.    PAIN MEDICATION USE AND EXPECTATIONS  You have likely been given narcotic medications to help control your pain.  After a traumatic event that results in an fracture (broken bone) with or without surgery, it is ok to use narcotic pain medications to help control one's pain.  We understand that everyone responds to pain differently and each individual patient will be evaluated on a regular basis for the continued need for narcotic medications. Ideally,  narcotic medication use should last no more than 6-8 weeks (coinciding with fracture healing).   As a patient it is your responsibility as well to monitor narcotic medication use and report the amount and frequency you use these medications when you come to your office visit.   We would also advise that if you are using narcotic medications, you should take a dose prior to therapy to maximize you participation.  IF YOU ARE ON NARCOTIC MEDICATIONS IT IS NOT PERMISSIBLE TO OPERATE A MOTOR VEHICLE (MOTORCYCLE/CAR/TRUCK/MOPED) OR HEAVY MACHINERY DO NOT MIX NARCOTICS WITH OTHER CNS (CENTRAL NERVOUS SYSTEM) DEPRESSANTS SUCH AS ALCOHOL  Diet: as you were eating previously.  Can use over the counter stool softeners and bowel preparations, such as Miralax, to help with bowel movements.  Narcotics can be constipating.  Be sure to drink plenty of fluids    STOP SMOKING OR USING NICOTINE PRODUCTS!!!!  As discussed nicotine severely impairs your body's ability to heal surgical and traumatic wounds but also impairs bone healing.  Wounds and bone heal by forming microscopic blood vessels (angiogenesis) and nicotine is a vasoconstrictor (essentially, shrinks blood vessels).  Therefore, if vasoconstriction occurs to these microscopic blood vessels they essentially disappear and are unable to deliver necessary nutrients to the healing tissue.  This is one modifiable factor that you can do to dramatically increase your chances of healing your injury.    (This means no smoking, no nicotine gum, patches, etc)  DO NOT USE NONSTEROIDAL ANTI-INFLAMMATORY DRUGS (NSAID'S)  Using  products such as Advil (ibuprofen), Aleve (naproxen), Motrin (ibuprofen) for additional pain control during fracture healing can delay and/or prevent the healing response.  If you would like to take over the counter (OTC) medication, Tylenol (acetaminophen) is ok.  However, some narcotic medications that are given for pain control contain  acetaminophen as well. Therefore, you should not exceed more than 4000 mg of tylenol in a day if you do not have liver disease.  Also note that there are may OTC medicines, such as cold medicines and allergy medicines that my contain tylenol as well.  If you have any questions about medications and/or interactions please ask your doctor/PA or your pharmacist.      ICE AND ELEVATE INJURED/OPERATIVE EXTREMITY  Using ice and elevating the injured extremity above your heart can help with swelling and pain control.  Icing in a pulsatile fashion, such as 20 minutes on and 20 minutes off, can be followed.    Do not place ice directly on skin. Make sure there is a barrier between to skin and the ice pack.    Using frozen items such as frozen peas works well as the conform nicely to the are that needs to be iced.  USE AN ACE WRAP OR TED HOSE FOR SWELLING CONTROL  In addition to icing and elevation, Ace wraps or TED hose are used to help limit and resolve swelling.  It is recommended to use Ace wraps or TED hose until you are informed to stop.    When using Ace Wraps start the wrapping distally (farthest away from the body) and wrap proximally (closer to the body)   Example: If you had surgery on your leg or thing and you do not have a splint on, start the ace wrap at the toes and work your way up to the thigh        If you had surgery on your upper extremity and do not have a splint on, start the ace wrap at your fingers and work your way up to the upper arm  IF YOU ARE IN A SPLINT OR CAST DO NOT REMOVE IT FOR ANY REASON   If your splint gets wet for any reason please contact the office immediately. You may shower in your splint or cast as long as you keep it dry.  This can be done by wrapping in a cast cover or garbage back (or similar)  Do Not stick any thing down your splint or cast such as pencils, money, or hangers to try and scratch yourself with.  If you feel itchy take benadryl as prescribed on the  bottle for itching  IF YOU ARE IN A CAM BOOT (BLACK BOOT)  You may remove boot periodically. Perform daily dressing changes as noted below.  Wash the liner of the boot regularly and wear a sock when wearing the boot. It is recommended that you sleep in the boot until told otherwise  CALL THE OFFICE WITH ANY QUESTIONS OR CONCERTS: [phone]    Information on my medicine - Coumadin   (Warfarin)  This medication education was reviewed with me or my healthcare representative as part of my discharge preparation.  The pharmacist that spoke with me during my hospital stay was:  Elwin Sleight, [facility]  Why was Coumadin prescribed for you? Coumadin was prescribed for you because you have a blood clot or a medical condition that can cause an increased risk of forming blood clots. Blood clots can cause serious health problems by  blocking the flow of blood to the heart, lung, or brain. Coumadin can prevent harmful blood clots from forming. As a reminder your indication for Coumadin is:   Blood Clot Prevention After Orthopedic Surgery  What test will check on my response to Coumadin? While on Coumadin (warfarin) you will need to have an INR test regularly to ensure that your dose is keeping you in the desired range. The INR (international normalized ratio) number is calculated from the result of the laboratory test called prothrombin time (PT).  If an INR APPOINTMENT HAS NOT ALREADY BEEN MADE FOR YOU please schedule an appointment to have this lab work done by your health care provider within 7 days. Your INR goal is usually a number between:  2 to 3 or your provider may give you a more narrow range like 2-2.5.  Ask your health care provider during an office visit what your goal INR is.  What  do you need to  know  About  COUMADIN? Take Coumadin (warfarin) exactly as prescribed by your healthcare provider about the same time each day.  DO NOT stop taking without talking to the doctor who prescribed the  medication.  Stopping without other blood clot prevention medication to take the place of Coumadin may increase your risk of developing a new clot or stroke.  Get refills before you run out.  What do you do if you miss a dose? If you miss a dose, take it as soon as you remember on the same day then continue your regularly scheduled regimen the next day.  Do not take two doses of Coumadin at the same time.  Important Safety Information A possible side effect of Coumadin (Warfarin) is an increased risk of bleeding. You should call your healthcare provider right away if you experience any of the following: ? Bleeding from an injury or your nose that does not stop. ? Unusual colored urine (red or dark brown) or unusual colored stools (red or black). ? Unusual bruising for unknown reasons. ? A serious fall or if you hit your head (even if there is no bleeding).  Some foods or medicines interact with Coumadin (warfarin) and might alter your response to warfarin. To help avoid this: ? Eat a balanced diet, maintaining a consistent amount of Vitamin K. ? Notify your provider about major diet changes you plan to make. ? Avoid alcohol or limit your intake to 1 drink for women and 2 drinks for men per day. (1 drink is 5 oz. wine, 12 oz. beer, or 1.5 oz. liquor.)  Make sure that ANY health care provider who prescribes medication for you knows that you are taking Coumadin (warfarin).  Also make sure the healthcare provider who is monitoring your Coumadin knows when you have started a new medication including herbals and non-prescription products.  Coumadin (Warfarin)  Major Drug Interactions  Increased Warfarin Effect Decreased Warfarin Effect  Alcohol (large quantities) Antibiotics (esp. Septra/Bactrim, Flagyl, Cipro) Amiodarone (Cordarone) Aspirin (ASA) Cimetidine (Tagamet) Megestrol (Megace) NSAIDs (ibuprofen, naproxen, etc.) Piroxicam (Feldene) Propafenone (Rythmol SR) Propranolol  (Inderal) Isoniazid (INH) Posaconazole (Noxafil) Barbiturates (Phenobarbital) Carbamazepine (Tegretol) Chlordiazepoxide (Librium) Cholestyramine (Questran) Griseofulvin Oral Contraceptives Rifampin Sucralfate (Carafate) Vitamin K   Coumadin (Warfarin) Major Herbal Interactions  Increased Warfarin Effect Decreased Warfarin Effect  Garlic Ginseng Ginkgo biloba Coenzyme Q10 Green tea St. Johns wort    Coumadin (Warfarin) FOOD Interactions  Eat a consistent number of servings per week of foods HIGH in Vitamin K (1 serving =  cup)  Collards (cooked, or boiled & drained) Kale (cooked, or boiled & drained) Mustard greens (cooked, or boiled & drained) Parsley *serving size only =  cup Spinach (cooked, or boiled & drained) Swiss chard (cooked, or boiled & drained) Turnip greens (cooked, or boiled & drained)  Eat a consistent number of servings per week of foods MEDIUM-HIGH in Vitamin K (1 serving = 1 cup)  Asparagus (cooked, or boiled & drained) Broccoli (cooked, boiled & drained, or raw & chopped) Brussel sprouts (cooked, or boiled & drained) *serving size only =  cup Lettuce, raw (green leaf, endive, romaine) Spinach, raw Turnip greens, raw & chopped   These websites have more information on Coumadin (warfarin):  http://www.king-russell.com/; https://www.hines.net/;  Observe your posterior total hip precautions. You may touchdown lightly with your right leg while transferring.

## 2016-02-09 NOTE — Progress Notes (Signed)
Called about not being able to get video, called IT then educational program working on trying to get video fixed for Mr. Brandon Robbins so he is informed [name]
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
## 2016-02-09 NOTE — Care Management Important Message (Signed)
Important Message  Patient Details  Name: Brandon CoxVester Robbins MRN: [id number] Date of Birth: 01/10/1941   Medicare Important Message Given:  Yes    Stevi Hollinshead P Jasmine Mcbeth 02/09/2016, 1:13 PM
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
## 2016-02-09 NOTE — Progress Notes (Signed)
Orthopedic Tech Progress Note Patient Details:  Brandon CoxVester Robbins 12/19/1940 [id number]  Ortho Devices Ortho Device/Splint Location: applied ohf to bed Ortho Device/Splint Interventions: Ordered, Application   Jennye MoccasinHughes, [name] 02/09/2016, 5:07 PM

## 2016-02-10 ENCOUNTER — Ambulatory Visit
Admit: 2016-02-10 | Discharge: 2016-02-10 | Disposition: A | Discharge status: Home / Self Care | Payer: Medicare PPO | Attending: Radiation Oncology | Admitting: Radiation Oncology

## 2016-02-10 ENCOUNTER — Encounter: Payer: Self-pay | Admitting: Radiation Oncology

## 2016-02-10 DIAGNOSIS — N400 Enlarged prostate without lower urinary tract symptoms: Secondary | ICD-10-CM | POA: Diagnosis present

## 2016-02-10 DIAGNOSIS — S32401A Unspecified fracture of right acetabulum, initial encounter for closed fracture: Secondary | ICD-10-CM

## 2016-02-10 LAB — CBC
HEMATOCRIT: 25.6 % — AB (ref 39.0–52.0)
Hemoglobin: 8.7 g/dL — ABNORMAL LOW (ref 13.0–17.0)
MCH: 29.3 pg (ref 26.0–34.0)
MCHC: 34 g/dL (ref 30.0–36.0)
MCV: 86.2 fL (ref 78.0–100.0)
Platelets: 102 10*3/uL — ABNORMAL LOW (ref 150–400)
RBC: 2.97 MIL/uL — ABNORMAL LOW (ref 4.22–5.81)
RDW: 15 % (ref 11.5–15.5)
WBC: 3.7 10*3/uL — ABNORMAL LOW (ref 4.0–10.5)

## 2016-02-10 LAB — VITAMIN D 25 HYDROXY (VIT D DEFICIENCY, FRACTURES): VIT D 25 HYDROXY: 30.2 ng/mL (ref 30.0–100.0)

## 2016-02-10 LAB — PROTIME-INR
INR: 1.29 (ref 0.00–1.49)
PROTHROMBIN TIME: 16.2 s — AB (ref 11.6–15.2)

## 2016-02-10 MED ORDER — WARFARIN SODIUM 7.5 MG PO TABS
7.5000 mg | ORAL_TABLET | Freq: Once | ORAL | Status: AC
  Administered 2016-02-10: 7.5 mg via ORAL
  Filled 2016-02-10: qty 1

## 2016-02-10 MED ORDER — TERAZOSIN HCL 5 MG PO CAPS
15.0000 mg | ORAL_CAPSULE | Freq: Every day | ORAL | Status: DC
  Administered 2016-02-10: 15 mg via ORAL
  Filled 2016-02-10 (×2): qty 3

## 2016-02-10 NOTE — Progress Notes (Signed)
   Pre-Radiation Transport Note:  IP nurse called: Galen DaftKim Urbantucker, RN Time:  02/10/2016,0830  IV location:  forearm right, saline locked IV fluids: No.   Last dose of pain medication given (name/time):  none  Ardell Isaacsutry, Domini Vandehei Cooke, RN 02/10/2016,0830
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
## 2016-02-10 NOTE — Progress Notes (Signed)
PT Cancellation Note  Patient Details Name: Brandon Robbins MRN: [id number] DOB: 05/28/1941   Cancelled Treatment:    Reason Eval/Treat Not Completed: Patient at procedure or test/unavailable. Pt out of building for radiation treatment. Will continue to follow.   Conni SlipperKirkman, Keron Neenan 02/10/2016, 2:29 PM  Conni SlipperLaura Luke Rigsbee, PT, DPT Acute Rehabilitation Services Pager: [phone]

## 2016-02-10 NOTE — Addendum Note (Signed)
Encounter addended by: Lessie Dingsnna W Rinda Rollyson, Rad Therap on: 02/10/2016 11:30 AM<BR>     Documentation filed: Fast Note

## 2016-02-10 NOTE — Progress Notes (Signed)
2215 patient was bladder scanned with results of 564, in & out of 600. Patient was bladder scanned again around 0300, in & out of 500cc. Patient has been able to void by himself this morning. Will cont to monitor.
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
## 2016-02-10 NOTE — Progress Notes (Signed)
OT Cancellation    02/10/16 1500  OT Visit Information  Last OT Received On 02/10/16  Reason Eval/Treat Not Completed Patient at procedure or test/ unavailable (At Radiation treatment)  [facility] Center[facility] Chloeann Alfred, OTR/L  [phone] 02/10/2016

## 2016-02-10 NOTE — Addendum Note (Signed)
Encounter addended by: Agnes LawrenceSamantha C Brandt Chaney, RN on: 02/10/2016  1:50 PM<BR>     Documentation filed: Notes Section
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
## 2016-02-10 NOTE — Addendum Note (Signed)
Encounter addended by: Margaretmary DysMatthew Markeria Goetsch, MD on: 02/10/2016  1:13 PM<BR>     Documentation filed: Follow-up Section, Problem List

## 2016-02-10 NOTE — Progress Notes (Signed)
[facility] Therapy Treatment Record Phone [phone]   Radiation Therapy was administered to Brandon CoxVester Robbins on: 02/10/2016  11:29 AM and was treatment # 1 out of a planned course of 1 treatments.  Radiation Treatment  1). Beam photons with 6-10 energy  2). Brachytherapy None  3). Stereotactic Radiosurgery None  4). Other Radiation None     Arletta Lumadue W, Rad Therap
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
## 2016-02-10 NOTE — Progress Notes (Signed)
See inpt consult 

## 2016-02-10 NOTE — Addendum Note (Signed)
Encounter addended by: Lacy DuverneyJanice B Tykee Heideman, RN on: 02/10/2016 11:20 AM<BR>     Documentation filed: Notes Section

## 2016-02-10 NOTE — Addendum Note (Signed)
Encounter addended by: Lacy DuverneyJanice B Decklyn Hornik, RN on: 02/10/2016 11:19 AM<BR>     Documentation filed: Notes Section

## 2016-02-10 NOTE — Progress Notes (Signed)
   Post Radiation Note:  Report called to Inpatient RN: Galen DaftKim Urbantucker, RN Time: 02/10/2016,1100  Was radiation completed: Yes.    Were any PRN medications given: No. Name and time: n/a  Were there any additional events: No  Ardell Isaacsutry, Shenita Trego Cooke, RN 02/10/2016,1100

## 2016-02-10 NOTE — Progress Notes (Addendum)
Called [facility] [phone],spoke with Bill, patient is ready to be picked up by [facility]  transportation,[facility] Annette StableBill stated he would call the transporters,patient is in room 1 at nursing, patient resting with eyes closed, mouth open, RR even unlabored  11:18 AM

## 2016-02-10 NOTE — Progress Notes (Deleted)
NURSING PROGRESS NOTE  Brandon CoxVester Sides [id number] Discharge Data: 02/10/2016 11:48 AM Attending Provider: Trauma Md, MD PCP:No primary care provider on file.     Brandon Robbins to be D/C'd Home per MD order.  Discussed with the patient the After Visit Summary and all questions fully answered. All IV's discontinued with no bleeding noted. Prescriptions given to patient. She is also aware that she needs to schedule a fu appointment. All belongings returned to patient for patient to take home.   Last Vital Signs:  Blood pressure 135/56, pulse 69, temperature 99 F (37.2 C), temperature source Oral, resp. rate 16, height 6' (1.829 m), weight 77.5 kg (170 lb 13.7 oz), SpO2 94 %.  Discharge Medication List   Medication List    ASK your doctor about these medications        enalapril 10 MG tablet  Commonly known as:  VASOTEC  Take 10 mg by mouth daily.     finasteride 5 MG tablet  Commonly known as:  PROSCAR  Take 5 mg by mouth every evening.     FLAXSEED OIL PO  Take 1 tablet by mouth daily.     gabapentin 300 MG capsule  Commonly known as:  NEURONTIN  Take 300 mg by mouth 3 (three) times daily.     GARLIC PO  Take 1 tablet by mouth daily.     multivitamin tablet  Take 1 tablet by mouth daily.     OVER THE COUNTER MEDICATION  Take 1 tablet by mouth daily. OTC med for heartburn that is a pink and white capsule.     terazosin 10 MG capsule  Commonly known as:  HYTRIN  Take 10 mg by mouth at bedtime.

## 2016-02-11 DIAGNOSIS — R339 Retention of urine, unspecified: Secondary | ICD-10-CM | POA: Diagnosis not present

## 2016-02-11 LAB — PROTIME-INR
INR: 1.91 — AB (ref 0.00–1.49)
PROTHROMBIN TIME: 21.8 s — AB (ref 11.6–15.2)

## 2016-02-11 MED ORDER — ENOXAPARIN SODIUM 40 MG/0.4ML ~~LOC~~ SOLN: 30.0000 mg | Freq: Two times a day (BID) | SUBCUTANEOUS | Status: AC

## 2016-02-11 MED ORDER — WARFARIN SODIUM 5 MG PO TABS: ORAL_TABLET | ORAL | Status: AC

## 2016-02-11 MED ORDER — TRAMADOL HCL 50 MG PO TABS: 50.0000 mg | ORAL_TABLET | Freq: Four times a day (QID) | ORAL | Status: AC | PRN
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
## 2016-02-11 NOTE — Progress Notes (Signed)
Discharge instructions gave to pt and his son. All question answered and pt is ready to go.

## 2016-02-11 NOTE — Care Management Note (Signed)
Case Management Note  Patient Details  Name: Brandon Robbins MRN: [id number] Date of Birth: 1941/09/25  Subjective/Objective:    Pt medically stable for dc home today with family.  Pt will dc home with foley catheter for urinary retention.  PT/OT recommending home therapies at dc.  Pt resides in [place], [place] in [place].  Son to provide transportation home today.                  Action/Plan: Met with pt to discuss home arrangements.  Pt needs 3 in 1 BSC and RW for home; referral to [facility] for DME needs.  Pt chooses [facility] for home care needs; start of care 24-48h post dc date.   Pt confirms address is [address] in [place], [place].  Best phone # to call is [phone].  Arranged HHPT/OT and HHRN for foley care.    Expected Discharge Date:     02/11/16             Expected Discharge Plan:  [place]  In-House Referral:     Discharge planning Services  CM Consult  Post Acute Care Choice:  Durable Medical Equipment, Home Health Choice offered to:     DME Arranged:  3-N-1, Walker rolling DME Agency:  [place]:  RN, PT, OT [facility] Agency:  Other - See comment  Status of Service:  Completed, signed off  Medicare Important Message Given:  Yes Date Medicare IM Given:    Medicare IM give by:    Date Additional Medicare IM Given:    Additional Medicare Important Message give by:     If discussed at [place] of Stay Meetings, dates discussed:    Additional Comments:  Reinaldo Raddle, RN, BSN  Trauma/Neuro ICU Case Manager [phone]
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
## 2016-02-24 NOTE — Progress Notes (Signed)
  Radiation Oncology         [phone] ________________________________  Name: Brandon Robbins MRN: [id number]  Date: 02/10/2016  DOB: 12/18/1940  End of Treatment Note  Diagnosis:   Risk for heterotopic ossification     Indication for treatment:  Prophylaxis       Radiation treatment dates:   02/10/2016  Site/dose:   7 Gy in one treatment  Beams/energy:   6 MV  Narrative: The patient tolerated radiation treatment relatively well.     Plan: The patient has completed radiation treatment. The patient will return to radiation oncology clinic for routine followup in one month. I advised him to call or return sooner if he has any questions or concerns related to his recovery or treatment. ________________________________  Artist PaisMatthew A. Kathrynn RunningManning, M.D.
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
## 2019-12-01 DEATH — deceased
